# Patient Record
Sex: Male | Born: 1985 | Race: Black or African American | Hispanic: No | Marital: Single | Smoking: Current every day smoker
Health system: Southern US, Community
[De-identification: ages and names within clinical notes are randomized; demographics above are authoritative.]

## PROBLEM LIST (undated history)

## (undated) DIAGNOSIS — N289 Disorder of kidney and ureter, unspecified: Secondary | ICD-10-CM

---

## 2013-09-23 ENCOUNTER — Encounter (HOSPITAL_COMMUNITY): Payer: Self-pay | Admitting: Emergency Medicine

## 2013-09-23 ENCOUNTER — Emergency Department (HOSPITAL_COMMUNITY)
Admission: EM | Admit: 2013-09-23 | Discharge: 2013-09-24 | Disposition: A | Discharge status: Home / Self Care | Payer: Self-pay | Attending: Emergency Medicine | Admitting: Emergency Medicine

## 2013-09-23 DIAGNOSIS — F17200 Nicotine dependence, unspecified, uncomplicated: Secondary | ICD-10-CM | POA: Insufficient documentation

## 2013-09-23 DIAGNOSIS — N201 Calculus of ureter: Secondary | ICD-10-CM | POA: Insufficient documentation

## 2013-09-23 LAB — COMPREHENSIVE METABOLIC PANEL
ALBUMIN: 4.3 g/dL (ref 3.5–5.2)
ALK PHOS: 63 U/L (ref 39–117)
ALT: 18 U/L (ref 0–53)
AST: 23 U/L (ref 0–37)
BILIRUBIN TOTAL: 0.4 mg/dL (ref 0.3–1.2)
BUN: 9 mg/dL (ref 6–23)
CHLORIDE: 99 meq/L (ref 96–112)
CO2: 29 meq/L (ref 19–32)
Calcium: 9.9 mg/dL (ref 8.4–10.5)
Creatinine, Ser: 0.99 mg/dL (ref 0.50–1.35)
GFR calc Af Amer: >90 mL/min (ref >90)
Glucose, Bld: 90 mg/dL (ref 70–99)
POTASSIUM: 4.7 meq/L (ref 3.7–5.3)
Sodium: 138 mEq/L (ref 137–147)
Total Protein: 9 g/dL — ABNORMAL HIGH (ref 6.0–8.3)

## 2013-09-23 LAB — CBC WITH DIFFERENTIAL/PLATELET
BASOS PCT: 0 % (ref 0–1)
Basophils Absolute: 0 10*3/uL (ref 0.0–0.1)
Eosinophils Absolute: 0.1 10*3/uL (ref 0.0–0.7)
Eosinophils Relative: 1 % (ref 0–5)
HCT: 46.3 % (ref 39.0–52.0)
Hemoglobin: 15.7 g/dL (ref 13.0–17.0)
LYMPHS PCT: 29 % (ref 12–46)
Lymphs Abs: 2.5 10*3/uL (ref 0.7–4.0)
MCH: 30.4 pg (ref 26.0–34.0)
MCHC: 33.9 g/dL (ref 30.0–36.0)
MCV: 89.7 fL (ref 78.0–100.0)
MONO ABS: 0.9 10*3/uL (ref 0.1–1.0)
Monocytes Relative: 10 % (ref 3–12)
NEUTROS ABS: 5 10*3/uL (ref 1.7–7.7)
NEUTROS PCT: 59 % (ref 43–77)
PLATELETS: 209 10*3/uL (ref 150–400)
RBC: 5.16 MIL/uL (ref 4.22–5.81)
RDW: 12.5 % (ref 11.5–15.5)
WBC: 8.5 10*3/uL (ref 4.0–10.5)

## 2013-09-23 LAB — LIPASE, BLOOD: Lipase: 90 U/L — ABNORMAL HIGH (ref 11–59)

## 2013-09-23 NOTE — ED Provider Notes (Signed)
CSN: [id number]     Arrival date & time 09/23/13  1956 History   First MD Initiated Contact with Patient 09/23/13 2259     Chief Complaint  Patient presents with  . Abdominal Pain   (Consider location/radiation/quality/duration/timing/severity/associated sxs/prior Treatment) Patient is a 28 y.o. male presenting with abdominal pain. The history is provided by the patient.  Abdominal Pain Pain location:  RLQ Pain quality: pressure   Pain radiates to:  Does not radiate Pain severity:  Severe Onset quality:  Sudden Duration:  2 days Timing:  Intermittent Chronicity:  New Context: not previous surgeries   Relieved by:  Lying down Worsened by:  Bowel movements Associated symptoms: no anorexia, no chest pain, no chills, no cough, no diarrhea, no dysuria, no fever, no nausea, no shortness of breath and no vomiting    Bradley Stewart is a 28 y.o. male who presents to the ED with right lower quadrant abdominal pain that comes and goes for the past 2 days. Currently he denies any pain, however, on the way to the ED the pain was severe. He denies nausea, vomiting. He is hungry.   History reviewed. No pertinent past medical history. History reviewed. No pertinent past surgical history. No family history on file. History  Substance Use Topics  . Smoking status: Current Every Day Smoker    Types: Cigarettes  . Smokeless tobacco: Not on file  . Alcohol Use: Yes    Review of Systems  Constitutional: Negative for fever and chills.  HENT: Negative.   Eyes: Negative for redness and visual disturbance.  Respiratory: Negative for cough and shortness of breath.   Cardiovascular: Negative for chest pain.  Gastrointestinal: Positive for abdominal pain. Negative for nausea, vomiting, diarrhea and anorexia.  Genitourinary: Negative for dysuria, urgency, frequency, discharge, scrotal swelling, penile pain and testicular pain.  Musculoskeletal: Negative for back pain and myalgias.  Skin: Negative for  rash.  Neurological: Negative for dizziness, syncope and headaches.  Psychiatric/Behavioral: Negative for confusion. The patient is not nervous/anxious.     Allergies  Review of patient's allergies indicates no known allergies.  Home Medications  No current outpatient prescriptions on file. BP 124/69  Pulse 64  Temp(Src) 98.4 F (36.9 C) (Oral)  Resp 18  Ht 6' (1.829 m)  Wt 180 lb (81.647 kg)  BMI 24.41 kg/m2  SpO2 98% Physical Exam  Nursing note and vitals reviewed. Constitutional: He is oriented to person, place, and time. He appears well-developed and well-nourished. No distress.  Eyes: Conjunctivae and EOM are normal.  Neck: Neck supple.  Cardiovascular: Normal rate and regular rhythm.   Pulmonary/Chest: Effort normal. He has no wheezes. He has no rales.  Abdominal: Soft. There is tenderness in the right lower quadrant. There is no rebound, no guarding and no CVA tenderness.  Musculoskeletal: Normal range of motion.  Neurological: He is alert and oriented to person, place, and time. No cranial nerve deficit.  Skin: Skin is warm and dry.  Psychiatric: He has a normal mood and affect. His behavior is normal.    ED Course: Dr. Patria Maneampos in to examine the patient.  Procedures  Results for orders placed during the hospital encounter of 09/23/13 (from the past 24 hour(s))  CBC WITH DIFFERENTIAL     Status: None   Collection Time    09/23/13 10:18 PM      Result Value Range   WBC 8.5  4.0 - 10.5 K/uL   RBC 5.16  4.22 - 5.81 MIL/uL   Hemoglobin 15.7  13.0 - 17.0 g/dL   HCT 96.0  45.4 - 09.8 %   MCV 89.7  78.0 - 100.0 fL   MCH 30.4  26.0 - 34.0 pg   MCHC 33.9  30.0 - 36.0 g/dL   RDW 11.9  14.7 - 82.9 %   Platelets 209  150 - 400 K/uL   Neutrophils Relative % 59  43 - 77 %   Neutro Abs 5.0  1.7 - 7.7 K/uL   Lymphocytes Relative 29  12 - 46 %   Lymphs Abs 2.5  0.7 - 4.0 K/uL   Monocytes Relative 10  3 - 12 %   Monocytes Absolute 0.9  0.1 - 1.0 K/uL   Eosinophils Relative  1  0 - 5 %   Eosinophils Absolute 0.1  0.0 - 0.7 K/uL   Basophils Relative 0  0 - 1 %   Basophils Absolute 0.0  0.0 - 0.1 K/uL  COMPREHENSIVE METABOLIC PANEL     Status: Abnormal   Collection Time    09/23/13 10:18 PM      Result Value Range   Sodium 138  137 - 147 mEq/L   Potassium 4.7  3.7 - 5.3 mEq/L   Chloride 99  96 - 112 mEq/L   CO2 29  19 - 32 mEq/L   Glucose, Bld 90  70 - 99 mg/dL   BUN 9  6 - 23 mg/dL   Creatinine, Ser 5.62  0.50 - 1.35 mg/dL   Calcium 9.9  8.4 - 13.0 mg/dL   Total Protein 9.0 (*) 6.0 - 8.3 g/dL   Albumin 4.3  3.5 - 5.2 g/dL   AST 23  0 - 37 U/L   ALT 18  0 - 53 U/L   Alkaline Phosphatase 63  39 - 117 U/L   Total Bilirubin 0.4  0.3 - 1.2 mg/dL   GFR calc non Af Amer >90  >90 mL/min   GFR calc Af Amer >90  >90 mL/min  LIPASE, BLOOD     Status: Abnormal   Collection Time    09/23/13 10:18 PM      Result Value Range   Lipase 90 (*) 11 - 59 U/L  URINALYSIS, ROUTINE W REFLEX MICROSCOPIC     Status: Abnormal   Collection Time    09/23/13 11:40 PM      Result Value Range   Color, Urine YELLOW  YELLOW   APPearance CLEAR  CLEAR   Specific Gravity, Urine 1.010  1.005 - 1.030   pH 5.5  5.0 - 8.0   Glucose, UA NEGATIVE  NEGATIVE mg/dL   Hgb urine dipstick MODERATE (*) NEGATIVE   Bilirubin Urine NEGATIVE  NEGATIVE   Ketones, ur NEGATIVE  NEGATIVE mg/dL   Protein, ur NEGATIVE  NEGATIVE mg/dL   Urobilinogen, UA 0.2  0.0 - 1.0 mg/dL   Nitrite NEGATIVE  NEGATIVE   Leukocytes, UA NEGATIVE  NEGATIVE  URINE MICROSCOPIC-ADD ON     Status: None   Collection Time    09/23/13 11:40 PM      Result Value Range   Squamous Epithelial / LPF RARE  RARE   WBC, UA 0-2  <3 WBC/hpf   RBC / HPF 11-20  <3 RBC/hpf   Bacteria, UA RARE  RARE    MDM  28 y.o. male with RLQ abdominal pain that comes and goes for the past 2 days. Patient awaiting CT to assess for possible renal stone. Care turned over to Dr. Patria Mane. Patient is without pain  at this time.     [address] [place],  [place] 09/24/13 [phone]

## 2013-09-23 NOTE — ED Notes (Signed)
Pt reports RLQ pain for the past 2 days, tonight it real intense & then eased up. Pt denies any vomiting or diarrhea.

## 2013-09-24 ENCOUNTER — Emergency Department (HOSPITAL_COMMUNITY): Payer: Self-pay

## 2013-09-24 LAB — URINE MICROSCOPIC-ADD ON

## 2013-09-24 LAB — URINALYSIS, ROUTINE W REFLEX MICROSCOPIC
BILIRUBIN URINE: NEGATIVE
GLUCOSE, UA: NEGATIVE mg/dL
KETONES UR: NEGATIVE mg/dL
Leukocytes, UA: NEGATIVE
Nitrite: NEGATIVE
PROTEIN: NEGATIVE mg/dL
Specific Gravity, Urine: 1.01 (ref 1.005–1.030)
Urobilinogen, UA: 0.2 mg/dL (ref 0.0–1.0)
pH: 5.5 (ref 5.0–8.0)

## 2013-09-24 MED ORDER — HYDROCODONE-ACETAMINOPHEN 5-325 MG PO TABS: 1.0000 | ORAL_TABLET | ORAL | Status: DC | PRN

## 2013-09-24 MED ORDER — IBUPROFEN 400 MG PO TABS
600.0000 mg | ORAL_TABLET | Freq: Once | ORAL | Status: AC
  Administered 2013-09-24: 600 mg via ORAL
  Filled 2013-09-24: qty 2

## 2013-09-24 MED ORDER — IBUPROFEN 600 MG PO TABS: 600.0000 mg | ORAL_TABLET | Freq: Three times a day (TID) | ORAL | Status: DC | PRN

## 2013-09-24 MED ORDER — PROMETHAZINE HCL 25 MG PO TABS: 25.0000 mg | ORAL_TABLET | Freq: Four times a day (QID) | ORAL | Status: DC | PRN

## 2013-09-24 NOTE — Discharge Instructions (Signed)
Ureteral Colic (Kidney Stones) °Ureteral colic is the result of a condition when kidney stones form inside the kidney. Once kidney stones are formed they may move into the tube that connects the kidney with the bladder (ureter). If this occurs, this condition may cause pain (colic) in the ureter.  °CAUSES  °Pain is caused by stone movement in the ureter and the obstruction caused by the stone. °SYMPTOMS  °The pain comes and goes as the ureter contracts around the stone. The pain is usually intense, sharp, and stabbing in character. The location of the pain may move as the stone moves through the ureter. When the stone is near the kidney the pain is usually located in the back and radiates to the belly (abdomen). When the stone is ready to pass into the bladder the pain is often located in the lower abdomen on the side the stone is located. At this location, the symptoms may mimic those of a urinary tract infection with urinary frequency. Once the stone is located here it often passes into the bladder and the pain disappears completely. °TREATMENT  °· Your caregiver will provide you with medicine for pain relief. °· You may require specialized follow-up X-rays. °· The absence of pain does not always mean that the stone has passed. It may have just stopped moving. If the urine remains completely obstructed, it can cause loss of kidney function or even complete destruction of the involved kidney. It is your responsibility and in your interest that X-rays and follow-ups as suggested by your caregiver are completed. Relief of pain without passage of the stone can be associated with severe damage to the kidney, including loss of kidney function on that side. °· If your stone does not pass on its own, additional measures may be taken by your caregiver to ensure its removal. °HOME CARE INSTRUCTIONS  °· Increase your fluid intake. Water is the preferred fluid since juices containing vitamin C may acidify the urine making it  less likely for certain stones (uric acid stones) to pass. °· Strain all urine. A strainer will be provided. Keep all particulate matter or stones for your caregiver to inspect. °· Take your pain medicine as directed. °· Make a follow-up appointment with your caregiver as directed. °· Remember that the goal is passage of your stone. The absence of pain does not mean the stone is gone. Follow your caregiver's instructions. °· Only take over-the-counter or prescription medicines for pain, discomfort, or fever as directed by your caregiver. °SEEK MEDICAL CARE IF:  °· Pain cannot be controlled with the prescribed medicine. °· You have a fever. °· Pain continues for longer than your caregiver advises it should. °· There is a change in the pain, and you develop chest discomfort or constant abdominal pain. °· You feel faint or pass out. °MAKE SURE YOU:  °· Understand these instructions. °· Will watch your condition. °· Will get help right away if you are not doing well or get worse. °Document Released: 05/29/2005 Document Revised: 12/14/2012 Document Reviewed: 02/13/2011 °ExitCare® Patient Information ©2014 [employer]. ° °

## 2013-09-24 NOTE — ED Provider Notes (Signed)
Medical screening examination/treatment/procedure(s) were performed by non-physician practitioner and as supervising physician I was immediately available for consultation/collaboration.  EKG Interpretation   None       Jeannetta Cerutti, MD, FACEP   Aristotelis Vilardi L Leondra Cullin, MD 09/24/13 0114 

## 2013-09-24 NOTE — ED Provider Notes (Signed)
Medical screening examination/treatment/procedure(s) were conducted as a shared visit with non-physician practitioner(s) and myself.  I personally evaluated the patient during the encounter.  Patient symptoms concerning for right ureteral colic.  CT scan pending at this time.  Pain controlled  EKG Interpretation   None       Ct Abdomen Pelvis Wo Contrast  09/24/2013   CLINICAL DATA:  Right lower quadrant pain  EXAM: CT ABDOMEN AND PELVIS WITHOUT CONTRAST  TECHNIQUE: Multidetector CT imaging of the abdomen and pelvis was performed following the standard protocol without intravenous contrast.  COMPARISON:  None available  FINDINGS: A 6 mm nodular opacity with somewhat angular margins is partially visualized within the right lower lobe (series 3, image 1). This finding is indeterminate. The visualized lungs are otherwise clear.  The liver demonstrates a normal unenhanced appearance. Gallbladder is within normal limits. No biliary ductal dilatation. The spleen, adrenal glands, and pancreas demonstrate a normal unenhanced appearance.  Nonobstructive 3 mm stone seen within the lower pole of the left kidney. No obstructive stone seen on the left. There is no left-sided hydronephrosis or hydroureter.  On the right, a 7 mm nonobstructive stone is present within the interpolar region. There is an obstructive 6 mm stone present at the right UVJ (series 2, image 73). This stone appears to lie within the bladder lumen. There is secondary mild right hydroureteronephrosis.  No evidence of bowel obstruction. The appendix is not definitely visualize, however, no inflammatory changes are seen within the right lower quadrant to suggest acute appendicitis. No abnormal wall thickening or inflammatory fat stranding seen about the bowels.  Bladder is unremarkable.  Prostate is grossly normal.  No free air or fluid. No enlarged intra-abdominal pelvic lymph nodes.  No acute osseous abnormality identified. No focal lytic or  blastic osseous lesion.  Ovoid well-circumscribed hypodense lesion measuring 2.5 cm within the subcutaneous fat of the right flank likely represents a sebaceous cyst.  IMPRESSION: 1. 6 mm right UVJ stone with secondary mild right hydroureteronephrosis. 2. Additional nonobstructive bilateral nephrolithiasis as above. 3. No other acute intra-abdominal pelvic process. 4. Indeterminate 6 mm nodular opacity within the right lower lobe, incompletely visualized. Short interval follow-up with dedicated imaging of the chest could be performed for complete evaluation of this finding as clinically indicated.   Electronically Signed   By: Rise MuBenjamin  McClintock M.D.   On: 09/24/2013 02:14  I personally reviewed the imaging tests through PACS system I reviewed available ER/hospitalization records through the EMR  2:22 AM Patient continues to feel good.  Patient with right-sided 6 mm ureteral U VJ stone.  Urology followup.  Strict return precautions given.  Discharge home in good condition.   Lyanne CoKevin M Danaisha Celli, MD 09/24/13 Earle [id number]

## 2014-02-15 ENCOUNTER — Emergency Department (HOSPITAL_COMMUNITY)
Admission: EM | Admit: 2014-02-15 | Discharge: 2014-02-15 | Disposition: A | Discharge status: Home / Self Care | Payer: Self-pay | Attending: Emergency Medicine | Admitting: Emergency Medicine

## 2014-02-15 ENCOUNTER — Emergency Department (HOSPITAL_COMMUNITY): Payer: Self-pay

## 2014-02-15 ENCOUNTER — Encounter (HOSPITAL_COMMUNITY): Payer: Self-pay | Admitting: Emergency Medicine

## 2014-02-15 DIAGNOSIS — R112 Nausea with vomiting, unspecified: Secondary | ICD-10-CM | POA: Insufficient documentation

## 2014-02-15 DIAGNOSIS — R109 Unspecified abdominal pain: Secondary | ICD-10-CM | POA: Insufficient documentation

## 2014-02-15 DIAGNOSIS — F17200 Nicotine dependence, unspecified, uncomplicated: Secondary | ICD-10-CM | POA: Insufficient documentation

## 2014-02-15 LAB — URINALYSIS, ROUTINE W REFLEX MICROSCOPIC
Bilirubin Urine: NEGATIVE
GLUCOSE, UA: NEGATIVE mg/dL
HGB URINE DIPSTICK: NEGATIVE
KETONES UR: NEGATIVE mg/dL
LEUKOCYTES UA: NEGATIVE
Nitrite: NEGATIVE
PROTEIN: NEGATIVE mg/dL
Specific Gravity, Urine: 1.015 (ref 1.005–1.030)
Urobilinogen, UA: 0.2 mg/dL (ref 0.0–1.0)
pH: 6.5 (ref 5.0–8.0)

## 2014-02-15 LAB — COMPREHENSIVE METABOLIC PANEL
ALBUMIN: 4.1 g/dL (ref 3.5–5.2)
ALT: 15 U/L (ref 0–53)
AST: 21 U/L (ref 0–37)
Alkaline Phosphatase: 68 U/L (ref 39–117)
BUN: 7 mg/dL (ref 6–23)
CHLORIDE: 99 meq/L (ref 96–112)
CO2: 30 mEq/L (ref 19–32)
Calcium: 9.5 mg/dL (ref 8.4–10.5)
Creatinine, Ser: 0.93 mg/dL (ref 0.50–1.35)
GFR calc Af Amer: >90 mL/min (ref >90)
GFR calc non Af Amer: >90 mL/min (ref >90)
Glucose, Bld: 108 mg/dL — ABNORMAL HIGH (ref 70–99)
POTASSIUM: 4.2 meq/L (ref 3.7–5.3)
Sodium: 138 mEq/L (ref 137–147)
Total Bilirubin: 0.3 mg/dL (ref 0.3–1.2)
Total Protein: 8.7 g/dL — ABNORMAL HIGH (ref 6.0–8.3)

## 2014-02-15 LAB — CBC WITH DIFFERENTIAL/PLATELET
BASOS PCT: 0 % (ref 0–1)
Basophils Absolute: 0 10*3/uL (ref 0.0–0.1)
Eosinophils Absolute: 0.1 10*3/uL (ref 0.0–0.7)
Eosinophils Relative: 1 % (ref 0–5)
HCT: 45.7 % (ref 39.0–52.0)
HEMOGLOBIN: 16 g/dL (ref 13.0–17.0)
LYMPHS ABS: 1.6 10*3/uL (ref 0.7–4.0)
Lymphocytes Relative: 16 % (ref 12–46)
MCH: 30.9 pg (ref 26.0–34.0)
MCHC: 35 g/dL (ref 30.0–36.0)
MCV: 88.4 fL (ref 78.0–100.0)
Monocytes Absolute: 0.8 10*3/uL (ref 0.1–1.0)
Monocytes Relative: 8 % (ref 3–12)
NEUTROS PCT: 75 % (ref 43–77)
Neutro Abs: 7.8 10*3/uL — ABNORMAL HIGH (ref 1.7–7.7)
Platelets: 233 10*3/uL (ref 150–400)
RBC: 5.17 MIL/uL (ref 4.22–5.81)
RDW: 12.2 % (ref 11.5–15.5)
WBC: 10.3 10*3/uL (ref 4.0–10.5)

## 2014-02-15 LAB — LIPASE, BLOOD: Lipase: 24 U/L (ref 11–59)

## 2014-02-15 MED ORDER — MORPHINE SULFATE 4 MG/ML IJ SOLN
4.0000 mg | Freq: Once | INTRAMUSCULAR | Status: AC
  Administered 2014-02-15: 4 mg via INTRAVENOUS
  Filled 2014-02-15: qty 1

## 2014-02-15 MED ORDER — DICYCLOMINE HCL 20 MG PO TABS: 20.0000 mg | ORAL_TABLET | Freq: Two times a day (BID) | ORAL | Status: AC

## 2014-02-15 MED ORDER — HYDROCODONE-ACETAMINOPHEN 5-325 MG PO TABS: ORAL_TABLET | ORAL | Status: DC

## 2014-02-15 MED ORDER — ONDANSETRON HCL 4 MG/2ML IJ SOLN
4.0000 mg | Freq: Once | INTRAMUSCULAR | Status: AC
  Administered 2014-02-15: 4 mg via INTRAVENOUS
  Filled 2014-02-15: qty 2

## 2014-02-15 MED ORDER — DICYCLOMINE HCL 20 MG PO TABS: 20.0000 mg | ORAL_TABLET | Freq: Two times a day (BID) | ORAL | Status: DC

## 2014-02-15 NOTE — ED Notes (Signed)
Pt c/o lower abd pain that radiates to left side x 2 days. Denies n/d. Vomited one this am. Denies black or bloody emesis. Denies urinary symptoms or penile discharge. Nad.

## 2014-02-15 NOTE — Care Management Note (Signed)
ED/CM noted patient did not have health insurance and/or PCP listed in the computer.  Patient was given the [place] resource handout with information on the clinics, food pantries, and the handout for new health insurance sign-up.  Patient expressed appreciation for information received. 

## 2014-02-15 NOTE — ED Provider Notes (Signed)
CSN: [id number]     Arrival date & time 02/15/14  [id number] History   First MD Initiated Contact with Patient 02/15/14 [phone]     Chief Complaint  Patient presents with  . Abdominal Pain  . Flank Pain     (Consider location/radiation/quality/duration/timing/severity/associated sxs/prior Treatment) Patient is a 28 y.o. male presenting with abdominal pain and flank pain. The history is provided by the patient.  Abdominal Pain Pain location:  Suprapubic and L flank Pain quality: aching and dull   Pain radiates to:  Does not radiate Pain severity:  Mild Onset quality:  Gradual Duration:  2 days Timing:  Constant Context: not alcohol use, not diet changes, not eating, not previous surgeries, not sick contacts, not suspicious food intake and not trauma   Relieved by:  Nothing Worsened by:  Nothing tried Ineffective treatments:  Acetaminophen and NSAIDs (Pepto-Bismol) Associated symptoms: nausea and vomiting   Associated symptoms: no chest pain, no chills, no constipation, no cough, no diarrhea, no dysuria, no fever, no flatus, no hematemesis, no hematochezia, no hematuria, no melena and no shortness of breath   Vomiting:    Quality:  Stomach contents   Number of occurrences:  1   Severity:  Mild   Duration:  2 hours   Timing:  Constant Risk factors: has not had multiple surgeries   Flank Pain Associated symptoms include abdominal pain, nausea and vomiting. Pertinent negatives include no chest pain, chills, coughing, fever, numbness, rash or weakness.    Patient complains of left flank and suprapubic pain for 2 days. States pain has been constant but describes as dull and aching. He also complains of nausea and vomited one time earlier this morning. Has a history of ureteral stones or loose year and states that the pain is somewhat similar but denies any urinary symptoms or hematuria.  He also denies fever, chest pain, chills, diarrhea or hematemesis. Patient also denies any previous surgery  to his abdomen. He states nothing makes the pain better or worse.  History reviewed. No pertinent past medical history. History reviewed. No pertinent past surgical history. History reviewed. No pertinent family history. History  Substance Use Topics  . Smoking status: Current Every Day Smoker    Types: Cigarettes  . Smokeless tobacco: Not on file  . Alcohol Use: Yes     Comment: beer a day    Review of Systems  Constitutional: Negative for fever, chills and appetite change.  Respiratory: Negative for cough and shortness of breath.   Cardiovascular: Negative for chest pain.  Gastrointestinal: Positive for nausea, vomiting and abdominal pain. Negative for diarrhea, constipation, blood in stool, melena, hematochezia, abdominal distention, flatus and hematemesis.  Genitourinary: Positive for flank pain. Negative for dysuria, frequency, hematuria, decreased urine volume, discharge, penile swelling, scrotal swelling, difficulty urinating, penile pain and testicular pain.  Musculoskeletal: Negative for back pain.  Skin: Negative for color change and rash.  Neurological: Negative for dizziness, weakness and numbness.  Hematological: Negative for adenopathy.  All other systems reviewed and are negative.     Allergies  Review of patient's allergies indicates no known allergies.  Home Medications   Prior to Admission medications   Not on File   BP 150/94  Pulse 52  Temp(Src) 98.2 F (36.8 C) (Oral)  Resp 18  SpO2 100% Physical Exam  Nursing note and vitals reviewed. Constitutional: He is oriented to person, place, and time. He appears well-developed and well-nourished. No distress.  HENT:  Head: Normocephalic and atraumatic.  Mouth/Throat: Oropharynx  is clear and moist.  Cardiovascular: Normal rate, regular rhythm, normal heart sounds and intact distal pulses.   No murmur heard. Pulmonary/Chest: Effort normal and breath sounds normal. No respiratory distress.  Abdominal:  Soft. Normal appearance and bowel sounds are normal. He exhibits no distension and no mass. There is no splenomegaly or hepatomegaly. There is tenderness in the suprapubic area. There is no rigidity, no rebound, no guarding, no CVA tenderness and no tenderness at McBurney's point.    Mild tenderness upon deep palpation of the suprapubic area and left lateral abdomen.  Abdomen is soft, no guarding or rebound tenderness. No peritoneal signs. No CVA tenderness  Musculoskeletal: Normal range of motion. He exhibits no edema.  Neurological: He is alert and oriented to person, place, and time. He exhibits normal muscle tone. Coordination normal.  Skin: Skin is warm and dry.    ED Course  Procedures (including critical care time) Labs Review Labs Reviewed  CBC WITH DIFFERENTIAL - Abnormal; Notable for the following:    Neutro Abs 7.8 (*)    All other components within normal limits  COMPREHENSIVE METABOLIC PANEL - Abnormal; Notable for the following:    Glucose, Bld 108 (*)    Total Protein 8.7 (*)    All other components within normal limits  URINALYSIS, ROUTINE W REFLEX MICROSCOPIC  LIPASE, BLOOD    Imaging Review Ct Abdomen Pelvis Wo Contrast  02/15/2014   CLINICAL DATA:  Lower abdominal pain radiating to the left flank for 2 days. History of kidney stones.  EXAM: CT ABDOMEN AND PELVIS WITHOUT CONTRAST  TECHNIQUE: Multidetector CT imaging of the abdomen and pelvis was performed following the standard protocol without IV contrast.  COMPARISON:  09/24/2013  FINDINGS: 6 mm right lower lobe (series 3, image 4) and 4 mm left lower lobe (series 3, image 16) lung nodules are unchanged.  The liver, gallbladder, spleen, adrenal glands, and pancreas have an unremarkable unenhanced appearance. 7 mm stone in the interpolar right kidney is unchanged. 2 mm stone in the lower pole of the left kidney is also unchanged. There is no hydronephrosis. No stones are identified along the course of the ureters, which  are nondilated. A right UVJ stone on the prior CT is no longer present.  The small and large bowel are grossly unremarkable without evidence of obstruction. Bladder is unremarkable. No free fluid or enlarged lymph nodes are identified. 2.5 cm subcutaneous hypodensity in the right flank is unchanged, likely a sebaceous cyst. No acute osseous abnormality is identified.  IMPRESSION: 1. Unchanged, nonobstructing bilateral renal calculi. 2. Unchanged, small bibasilar lung nodules, indeterminate.   Electronically Signed   By: Sebastian Ache   On: 02/15/2014 11:38    EKG Interpretation None      MDM   Final diagnoses:  Abdominal pain   Male with left flank and LLQ pain for 2 days.  Pain is not associated with food, no fever, one episode of vomiting.  No hx of penile d/c, testicular pain or swelling.  Reports pain is similar to previous kidney stone.  Pt hx and care plan discussed with Dr. Estell Harpin and he also evaluated pt.    Discussed CT results with radiologist, Dr. Mosetta Putt.  He stated to me that while appendix is not well seen, no signs of inflammatory changes are seen in the RLQ and pt sx's are on opposite side.   Lab and CT scan results discussed with pt, No ureteral stone on CT. Pt is feeling better after IV medications  and requesting d/c.  He is well appearing.  VSS.  No vomiting during ed stay.  Pt advised to return here if sx's are not improving or worsen.  He agrees to plan and appears stable for d/c.    Tammy L. Trisha Mangleriplett, PA-C 02/17/14 2130

## 2014-02-15 NOTE — Discharge Instructions (Signed)
Abdominal Pain, Adult  Many things can cause belly (abdominal) pain. Most times, the belly pain is not dangerous. Many cases of belly pain can be watched and treated at home.  HOME CARE   · Do not take medicines that help you go poop (laxatives) unless told to by your doctor.  · Only take medicine as told by your doctor.  · Eat or drink as told by your doctor. Your doctor will tell you if you should be on a special diet.  GET HELP IF:  · You do not know what is causing your belly pain.  · You have belly pain while you are sick to your stomach (nauseous) or have runny poop (diarrhea).  · You have pain while you pee or poop.  · Your belly pain wakes you up at night.  · You have belly pain that gets worse or better when you eat.  · You have belly pain that gets worse when you eat fatty foods.  GET HELP RIGHT AWAY IF:   · The pain does not go away within 2 hours.  · You have a fever.  · You keep throwing up (vomiting).  · The pain changes and is only in the right or left part of the belly.  · You have bloody or tarry looking poop.  MAKE SURE YOU:   · Understand these instructions.  · Will watch your condition.  · Will get help right away if you are not doing well or get worse.  Document Released: 02/05/2008 Document Revised: 06/09/2013 Document Reviewed: 04/28/2013  ExitCare® Patient Information ©2014 [employer].

## 2014-02-18 NOTE — ED Provider Notes (Signed)
Medical screening examination/treatment/procedure(s) were performed by non-physician practitioner and as supervising physician I was immediately available for consultation/collaboration.   EKG Interpretation None        Joseph L Zammit, MD 02/18/14 1547 

## 2014-11-21 ENCOUNTER — Encounter (HOSPITAL_COMMUNITY): Payer: Self-pay | Admitting: Emergency Medicine

## 2014-11-21 ENCOUNTER — Emergency Department (HOSPITAL_COMMUNITY)
Admission: EM | Admit: 2014-11-21 | Discharge: 2014-11-21 | Disposition: A | Discharge status: Home / Self Care | Payer: Self-pay | Attending: Emergency Medicine | Admitting: Emergency Medicine

## 2014-11-21 DIAGNOSIS — Z87442 Personal history of urinary calculi: Secondary | ICD-10-CM | POA: Insufficient documentation

## 2014-11-21 DIAGNOSIS — Z79899 Other long term (current) drug therapy: Secondary | ICD-10-CM | POA: Insufficient documentation

## 2014-11-21 DIAGNOSIS — L0201 Cutaneous abscess of face: Secondary | ICD-10-CM | POA: Insufficient documentation

## 2014-11-21 DIAGNOSIS — Z87448 Personal history of other diseases of urinary system: Secondary | ICD-10-CM | POA: Insufficient documentation

## 2014-11-21 DIAGNOSIS — Z720 Tobacco use: Secondary | ICD-10-CM | POA: Insufficient documentation

## 2014-11-21 HISTORY — DX: Disorder of kidney and ureter, unspecified: N28.9

## 2014-11-21 MED ORDER — DOXYCYCLINE HYCLATE 100 MG PO TABS
100.0000 mg | ORAL_TABLET | Freq: Once | ORAL | Status: AC
  Administered 2014-11-21: 100 mg via ORAL
  Filled 2014-11-21: qty 1

## 2014-11-21 MED ORDER — CIPROFLOXACIN HCL 500 MG PO TABS: 500.0000 mg | ORAL_TABLET | Freq: Two times a day (BID) | ORAL | Status: AC

## 2014-11-21 MED ORDER — AMOXICILLIN-POT CLAVULANATE 875-125 MG PO TABS
1.0000 | ORAL_TABLET | Freq: Once | ORAL | Status: AC
  Administered 2014-11-21: 1 via ORAL
  Filled 2014-11-21: qty 1

## 2014-11-21 MED ORDER — HYDROCODONE-ACETAMINOPHEN 5-325 MG PO TABS: 1.0000 | ORAL_TABLET | ORAL | Status: AC | PRN

## 2014-11-21 MED ORDER — LIDOCAINE-EPINEPHRINE (PF) 1 %-1:200000 IJ SOLN
10.0000 mL | Freq: Once | INTRAMUSCULAR | Status: AC
  Administered 2014-11-21: 10 mL
  Filled 2014-11-21: qty 10

## 2014-11-21 MED ORDER — KETOROLAC TROMETHAMINE 10 MG PO TABS
10.0000 mg | ORAL_TABLET | Freq: Once | ORAL | Status: AC
  Administered 2014-11-21: 10 mg via ORAL
  Filled 2014-11-21: qty 1

## 2014-11-21 MED ORDER — SULFAMETHOXAZOLE-TRIMETHOPRIM 800-160 MG PO TABS: 1.0000 | ORAL_TABLET | Freq: Two times a day (BID) | ORAL | Status: AC

## 2014-11-21 NOTE — Discharge Instructions (Signed)
Abscess °An abscess (boil or furuncle) is an infected area on or under the skin. This area is filled with yellowish-white fluid (pus) and other material (debris). °HOME CARE  °· Only take medicines as told by your doctor. °· If you were given antibiotic medicine, take it as directed. Finish the medicine even if you start to feel better. °· If gauze is used, follow your doctor's directions for changing the gauze. °· To avoid spreading the infection: °¨ Keep your abscess covered with a bandage. °¨ Wash your hands well. °¨ Do not share personal care items, towels, or whirlpools with others. °¨ Avoid skin contact with others. °· Keep your skin and clothes clean around the abscess. °· Keep all doctor visits as told. °GET HELP RIGHT AWAY IF:  °· You have more pain, puffiness (swelling), or redness in the wound site. °· You have more fluid or blood coming from the wound site. °· You have muscle aches, chills, or you feel sick. °· You have a fever. °MAKE SURE YOU:  °· Understand these instructions. °· Will watch your condition. °· Will get help right away if you are not doing well or get worse. °Document Released: 02/05/2008 Document Revised: 02/18/2012 Document Reviewed: 11/01/2011 °ExitCare® Patient Information ©2015 [employer]. This information is not intended to replace advice given to you by your health care provider. Make sure you discuss any questions you have with your health care provider. ° °

## 2014-11-21 NOTE — ED Notes (Signed)
Pt reports right ear cyst x1 week. Pt reports recently "got larger." nad noted.

## 2014-11-21 NOTE — ED Provider Notes (Signed)
CSN: [id number]     Arrival date & time 11/21/14  1111 History  This chart was scribed for Ivery Quale, PA-C with Bethann Berkshire, MD by Tonye Royalty, ED Scribe. This patient was seen in room APFT21/APFT21 and the patient's care was started at 12:29 PM.    Chief Complaint  Patient presents with  . Cyst   Patient is a 29 y.o. male presenting with abscess. The history is provided by the patient. No language interpreter was used.  Abscess Location:  Head/neck Head/neck abscess location:  Head Abscess quality: draining   Red streaking: no   Duration: "a few months" Progression:  Worsening Chronicity:  New Context: not diabetes   Relieved by:  Nothing Worsened by:  Draining/squeezing Ineffective treatments:  None tried Associated symptoms: no fever     HPI Comments: Bradley Stewart is a 29 y.o. male who presents to the Emergency Department complaining of abscess to his right face, first noticed a few months ago but increasing in size since 1 week ago after squeezing it. He reports some drainage upon squeezing it. He states it might have begun after shaving. He notes a swollen lymph gland in his neck. He denies fever, ear pain, or other symptoms.   Past Medical History  Diagnosis Date  . Renal disorder     kidney stones   History reviewed. No pertinent past surgical history. History reviewed. No pertinent family history. History  Substance Use Topics  . Smoking status: Current Every Day Smoker -- 1.00 packs/day    Types: Cigarettes  . Smokeless tobacco: Not on file  . Alcohol Use: Yes     Comment: beer a day    Review of Systems  Constitutional: Negative for fever.  HENT: Negative for ear pain.   Skin:       Cyst to right face  All other systems reviewed and are negative.     Allergies  Review of patient's allergies indicates no known allergies.  Home Medications   Prior to Admission medications   Medication Sig Start Date End Date Taking? Authorizing Provider   dicyclomine (BENTYL) 20 MG tablet Take 1 tablet (20 mg total) by mouth 2 (two) times daily. 02/15/14   Tammi Triplett, PA-C  HYDROcodone-acetaminophen (NORCO/VICODIN) 5-325 MG per tablet Take one-two tabs po q 4-6 hrs prn pain 02/15/14   Tammi Triplett, PA-C   BP 149/85 mmHg  Pulse 76  Temp(Src) 98.2 F (36.8 C) (Oral)  Resp 20  Ht 6' (1.829 m)  Wt 190 lb (86.183 kg)  BMI 25.76 kg/m2  SpO2 100% Physical Exam  Constitutional: He is oriented to person, place, and time. He appears well-developed and well-nourished.  HENT:  Head: Normocephalic and atraumatic.  4x2.7cm abscess just below tragus of right ear extending into the temporal area Palpable nodes of submental and cervical chain Area is warm but not hot No invasion of the abscess into the auditory canal TM cannot be visualized because of cerumen impaction  Eyes: Conjunctivae are normal.  Neck: Normal range of motion. Neck supple.  Cardiovascular: Normal rate, regular rhythm and normal heart sounds.   No murmur heard. Pulmonary/Chest: Effort normal and breath sounds normal. No respiratory distress. He has no wheezes. He has no rales.  Musculoskeletal: Normal range of motion.  Neurological: He is alert and oriented to person, place, and time.  Skin: Skin is warm and dry.  Psychiatric: He has a normal mood and affect.  Nursing note and vitals reviewed.   ED Course  Procedures (  including critical care time)  DIAGNOSTIC STUDIES: Oxygen Saturation is 100% on room air, normal by my interpretation.    COORDINATION OF CARE: 12:38 PM Discussed treatment plan with patient at beside, including incision and drainage here, sending a sample for culture, and antibiotic. The patient agrees with the plan and has no further questions at this time.  INCISION AND DRAINAGE PROCEDURE NOTE: Patient identification was confirmed and verbal consent was obtained. This procedure was performed by Ivery QualeHobson Liborio Saccente, PA-C at 1:01 PM. Time out: Immediately  prior to procedure a "time out" was called to verify the correct patient, procedure, equipment, support staff and site/side marked as required. Site: right temporal area Sterile procedures observed Anesthetic used (type and amt): lidocaine with epinephrine 3cc Blade size: 11 Drainage: copious Complexity: Complex Site anesthetized, incision made over site, wound drained and explored loculations, rinsed with copious amounts of normal saline, wound packed with sterile gauze, covered with dry, sterile dressing. A sample was sent for culture.  Pt tolerated procedure well without complications.  Instructions for care discussed verbally and pt provided with additional written instructions for homecare and f/u.   Labs Review Labs Reviewed - No data to display  Imaging Review No results found.   EKG Interpretation None      MDM  Pt had abscess of the right temporal area. No vital sign changes. No ear involvement. I and D carried out. Culture sent to  The lab. Pt started on bactrim and cipro. Pt to return of any changes or problem.   Final diagnoses:  None    **I have reviewed nursing notes, vital signs, and all appropriate lab and imaging results for this patient.*  **I personally performed the services described in this documentation, which was scribed in my presence. The recorded information has been reviewed and is accurate.Ivery Quale*  Eldena Dede, PA-C 11/23/14 2235  Bethann BerkshireJoseph Zammit, MD 11/24/14 [phone]

## 2014-11-21 NOTE — ED Notes (Signed)
Pt verbalized understanding of no driving and to use caution within 4 hours of taking pain meds due to meds cause drowsiness 

## 2014-11-25 LAB — CULTURE, ROUTINE-ABSCESS

## 2015-09-23 IMAGING — CT CT ABD-PELV W/O CM
2 of 3 series · 9 of 46 positions shown, 11 images · non-contrast
Comparison: None available

CLINICAL DATA: Right lower quadrant pain

EXAM:
CT ABDOMEN AND PELVIS WITHOUT CONTRAST
TECHNIQUE: Multidetector CT imaging of the abdomen and pelvis was performed
following the standard protocol without intravenous contrast.

[Series 4: mpr coronal (id) · coronal · 0.64mm/px · 8 of 73 slices shown, 9 images]
[im 9/73  soft-tissue]
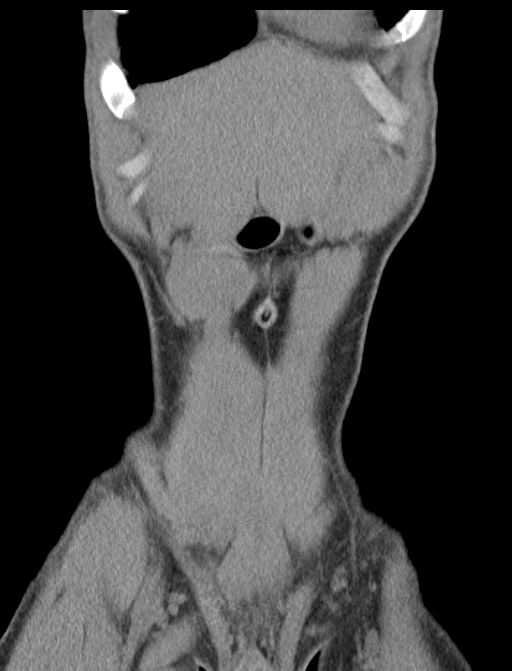
[im 9/73  bone]
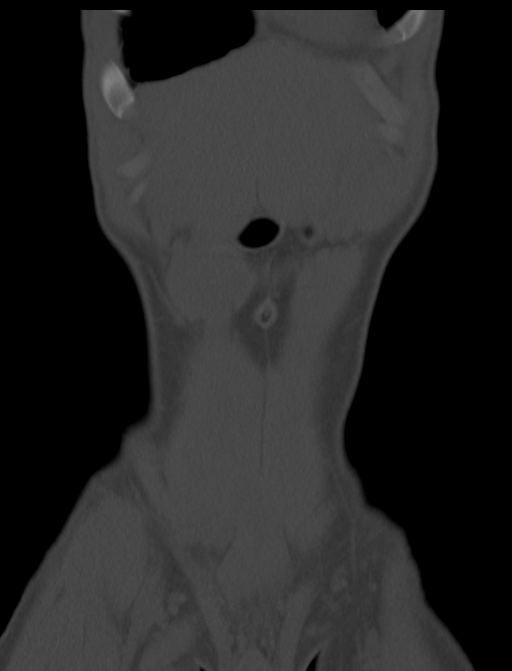
[im 17/73  soft-tissue]
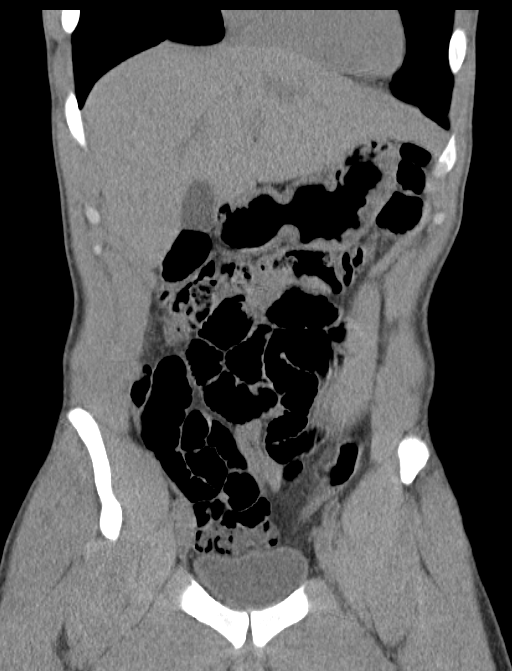
[im 25/73  soft-tissue]
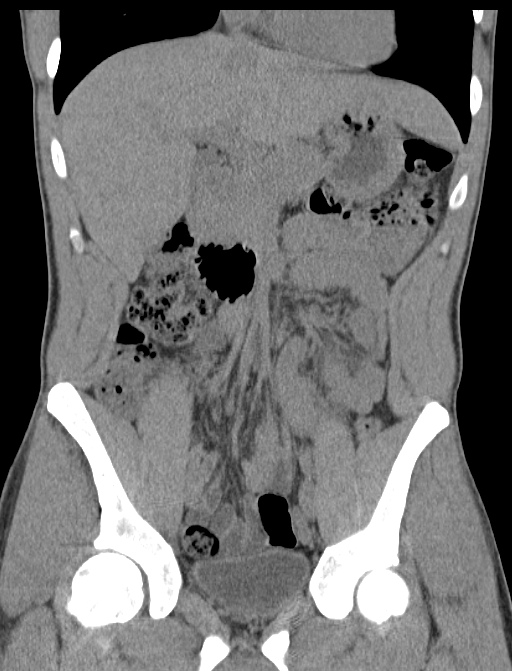
[im 33/73  soft-tissue]
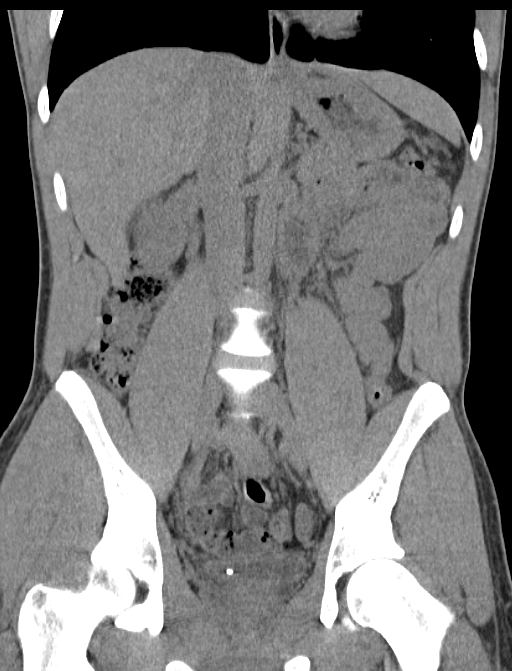
[im 41/73  soft-tissue]
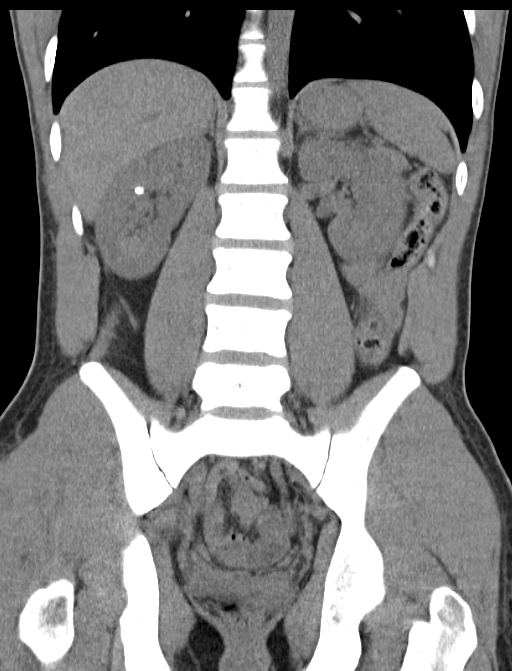
[im 49/73  soft-tissue]
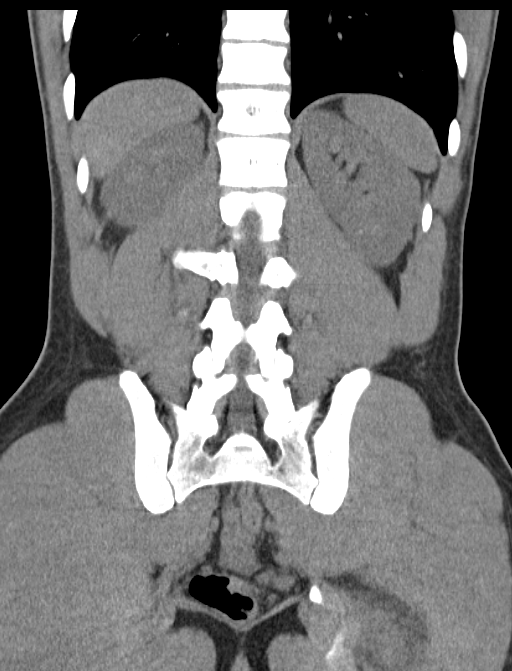
[im 57/73  soft-tissue]
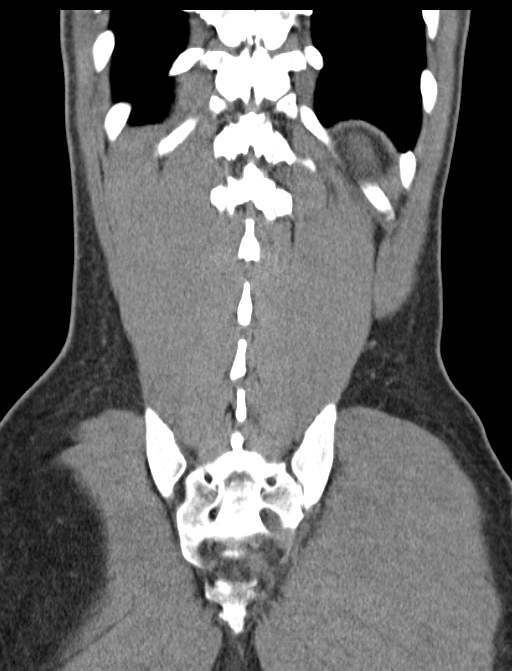
[im 65/73  soft-tissue]
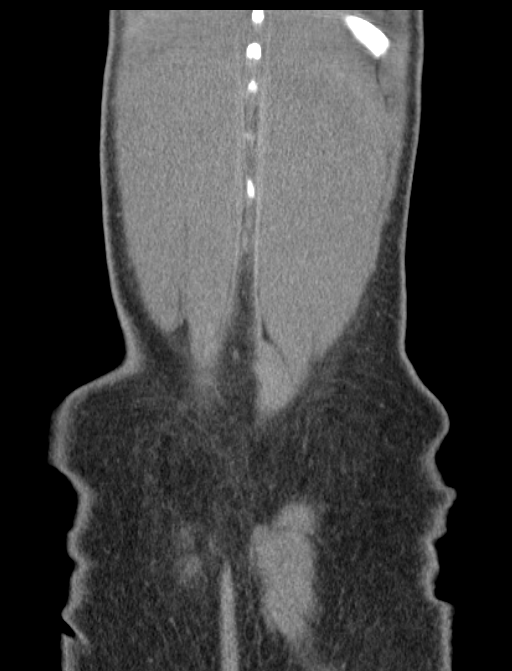

[Series 5: mpr sagittal (id) · sagittal · 0.48mm/px · 1 of 110 slices shown, 2 images]
[im 37/110  soft-tissue]
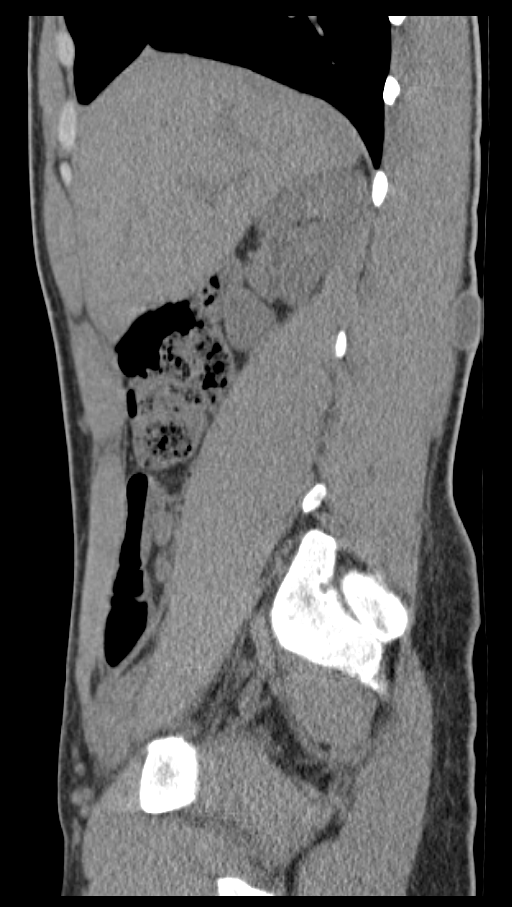
[im 37/110  bone]
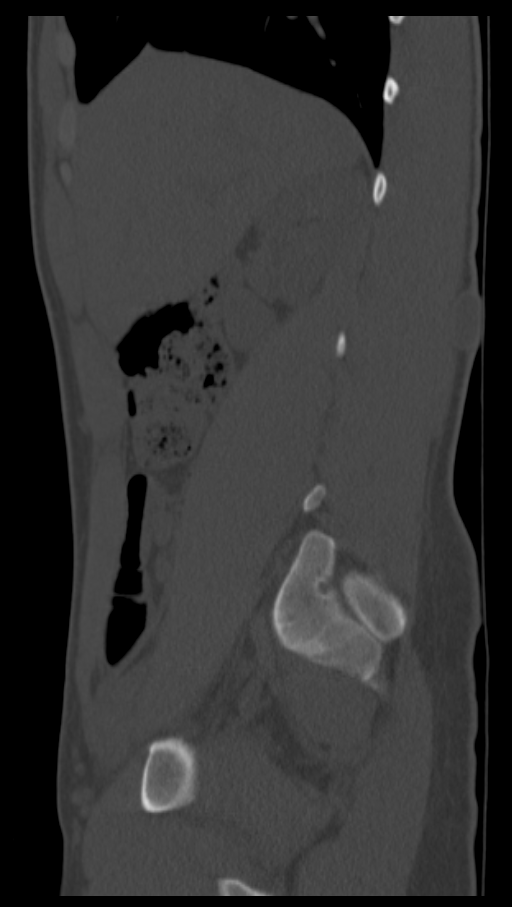

[9 of 46 positions shown; findings below may reference images not displayed]

FINDINGS: A 6 mm nodular opacity with somewhat angular margins is partially
visualized within the right lower lobe (series 3, image 1). This
finding is indeterminate. The visualized lungs are otherwise clear.

The liver demonstrates a normal unenhanced appearance. Gallbladder
is within normal limits. No biliary ductal dilatation. The spleen,
adrenal glands, and pancreas demonstrate a normal unenhanced
appearance.

Nonobstructive 3 mm stone seen within the lower pole of the left
kidney. No obstructive stone seen on the left. There is no
left-sided hydronephrosis or hydroureter.

On the right, a 7 mm nonobstructive stone is present within the
interpolar region. There is an obstructive 6 mm stone present at the
right UVJ (series 2, image 73). This stone appears to lie within the
bladder lumen. There is secondary mild right hydroureteronephrosis.

No evidence of bowel obstruction. The appendix is not definitely
visualize, however, no inflammatory changes are seen within the
right lower quadrant to suggest acute appendicitis. No abnormal wall
thickening or inflammatory fat stranding seen about the bowels.

Bladder is unremarkable.  Prostate is grossly normal.

No free air or fluid. No enlarged intra-abdominal pelvic lymph
nodes.

No acute osseous abnormality identified. No focal lytic or blastic
osseous lesion.

Ovoid well-circumscribed hypodense lesion measuring 2.5 cm within
the subcutaneous fat of the right flank likely represents a
sebaceous cyst.
IMPRESSION: 1. 6 mm right UVJ stone with secondary mild right
hydroureteronephrosis.
2. Additional nonobstructive bilateral nephrolithiasis as above.
3. No other acute intra-abdominal pelvic process.
4. Indeterminate 6 mm nodular opacity within the right lower lobe,
incompletely visualized. Short interval follow-up with dedicated
imaging of the chest could be performed for complete evaluation of
this finding as clinically indicated.

## 2022-12-01 ENCOUNTER — Emergency Department (HOSPITAL_COMMUNITY)
Admission: EM | Admit: 2022-12-01 | Discharge: 2022-12-01 | Disposition: A | Discharge status: Home / Self Care | Payer: BC Managed Care – PPO | Attending: Emergency Medicine | Admitting: Emergency Medicine

## 2022-12-01 ENCOUNTER — Encounter (HOSPITAL_COMMUNITY): Payer: Self-pay

## 2022-12-01 ENCOUNTER — Other Ambulatory Visit: Payer: Self-pay

## 2022-12-01 ENCOUNTER — Emergency Department (HOSPITAL_COMMUNITY): Payer: BC Managed Care – PPO

## 2022-12-01 DIAGNOSIS — Y92410 Unspecified street and highway as the place of occurrence of the external cause: Secondary | ICD-10-CM | POA: Insufficient documentation

## 2022-12-01 DIAGNOSIS — S161XXA Strain of muscle, fascia and tendon at neck level, initial encounter: Secondary | ICD-10-CM | POA: Insufficient documentation

## 2022-12-01 DIAGNOSIS — R519 Headache, unspecified: Secondary | ICD-10-CM | POA: Diagnosis not present

## 2022-12-01 DIAGNOSIS — R0781 Pleurodynia: Secondary | ICD-10-CM | POA: Diagnosis not present

## 2022-12-01 DIAGNOSIS — M542 Cervicalgia: Secondary | ICD-10-CM | POA: Diagnosis present

## 2022-12-01 MED ORDER — OXYCODONE-ACETAMINOPHEN 5-325 MG PO TABS
2.0000 | ORAL_TABLET | Freq: Once | ORAL | Status: AC
  Administered 2022-12-01: 2 via ORAL
  Filled 2022-12-01: qty 2

## 2022-12-01 MED ORDER — KETOROLAC TROMETHAMINE 60 MG/2ML IM SOLN
60.0000 mg | Freq: Once | INTRAMUSCULAR | Status: AC
  Administered 2022-12-01: 60 mg via INTRAMUSCULAR
  Filled 2022-12-01: qty 2
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
## 2022-12-01 NOTE — ED Notes (Signed)
Patient transported to CT
